# Patient Record
Sex: Female | Born: 1970 | Race: White | Hispanic: No | Marital: Single | Smoking: Current every day smoker
Health system: Southern US, Community
[De-identification: ages and names within clinical notes are randomized; demographics above are authoritative.]

## PROBLEM LIST (undated history)

## (undated) DIAGNOSIS — J449 Chronic obstructive pulmonary disease, unspecified: Secondary | ICD-10-CM

## (undated) HISTORY — PX: TUBAL LIGATION: SHX77

---

## 2001-02-07 ENCOUNTER — Inpatient Hospital Stay (HOSPITAL_COMMUNITY): Admission: AD | Admit: 2001-02-07 | Discharge: 2001-02-09 | Payer: Self-pay | Admitting: Obstetrics and Gynecology

## 2001-03-18 ENCOUNTER — Other Ambulatory Visit: Admission: RE | Admit: 2001-03-18 | Discharge: 2001-03-18 | Payer: Self-pay | Admitting: Obstetrics and Gynecology

## 2009-04-29 ENCOUNTER — Emergency Department (HOSPITAL_BASED_OUTPATIENT_CLINIC_OR_DEPARTMENT_OTHER): Admission: EM | Admit: 2009-04-29 | Discharge: 2009-04-29 | Payer: Self-pay | Admitting: Emergency Medicine

## 2009-09-25 ENCOUNTER — Emergency Department (HOSPITAL_BASED_OUTPATIENT_CLINIC_OR_DEPARTMENT_OTHER): Admission: EM | Admit: 2009-09-25 | Discharge: 2009-09-25 | Payer: Self-pay | Admitting: Emergency Medicine

## 2010-09-27 LAB — URINALYSIS, ROUTINE W REFLEX MICROSCOPIC
Bilirubin Urine: NEGATIVE
Nitrite: POSITIVE — AB
Specific Gravity, Urine: 1.009 (ref 1.005–1.030)
Urobilinogen, UA: 0.2 mg/dL (ref 0.0–1.0)
pH: 6.5 (ref 5.0–8.0)

## 2010-09-27 LAB — URINE MICROSCOPIC-ADD ON

## 2010-09-27 LAB — WET PREP, GENITAL

## 2010-09-27 LAB — GC/CHLAMYDIA PROBE AMP, GENITAL: Chlamydia, DNA Probe: NEGATIVE

## 2010-09-27 LAB — PREGNANCY, URINE: Preg Test, Ur: NEGATIVE

## 2015-05-13 ENCOUNTER — Encounter: Payer: Self-pay | Admitting: Emergency Medicine

## 2015-05-13 ENCOUNTER — Emergency Department
Admission: EM | Admit: 2015-05-13 | Discharge: 2015-05-13 | Disposition: A | Discharge status: Home / Self Care | Payer: Self-pay | Source: Home / Self Care | Attending: Family Medicine | Admitting: Family Medicine

## 2015-05-13 DIAGNOSIS — J029 Acute pharyngitis, unspecified: Secondary | ICD-10-CM

## 2015-05-13 DIAGNOSIS — J209 Acute bronchitis, unspecified: Secondary | ICD-10-CM

## 2015-05-13 MED ORDER — BENZONATATE 100 MG PO CAPS: 100.0000 mg | ORAL_CAPSULE | Freq: Three times a day (TID) | ORAL | Status: DC

## 2015-05-13 MED ORDER — PREDNISONE 20 MG PO TABS: ORAL_TABLET | ORAL | Status: DC

## 2015-05-13 MED ORDER — AZITHROMYCIN 250 MG PO TABS: 250.0000 mg | ORAL_TABLET | Freq: Every day | ORAL | Status: DC

## 2015-05-13 MED ORDER — ALBUTEROL SULFATE HFA 108 (90 BASE) MCG/ACT IN AERS: 1.0000 | INHALATION_SPRAY | Freq: Four times a day (QID) | RESPIRATORY_TRACT | Status: DC | PRN

## 2015-05-13 NOTE — ED Provider Notes (Signed)
CSN: [id number]     Arrival date & time 05/13/15  1527 History   First MD Initiated Contact with Patient 05/13/15 1534     Chief Complaint  Patient presents with  . Sore Throat   (Consider location/radiation/quality/duration/timing/severity/associated sxs/prior Treatment) HPI  Pt is a 44yo female presenting to [facility] with c/o gradually worsening sore throat with congestion and moderately productive cough with green mucous that started 4 days ago. Associated fatigue, body aches, loose stools, and headaches.  She states her son had sinus congestion last week but nowhere near as congested as pt is today.  She has tried OTC allergy medication w/o relief.  She reports hx of bronchitis and states she is a daily cigarette smoker. No hx of asthma.  She has used an inhaler last time she had bronchitis but was unsure if it helped. Denies fever, nausea or vomiting. No recent travel. Denies difficulty breathing or swallowing. She did not receive flu vaccine this year.  History reviewed. No pertinent past medical history. History reviewed. No pertinent past surgical history. History reviewed. No pertinent family history. Social History  Substance Use Topics  . Smoking status: Current Every Day Smoker    Types: Cigarettes  . Smokeless tobacco: None  . Alcohol Use: No   OB History    No data available     Review of Systems  Constitutional: Positive for fatigue. Negative for fever and chills.  HENT: Positive for congestion, rhinorrhea, sinus pressure and sore throat. Negative for ear pain, trouble swallowing and voice change.   Respiratory: Positive for cough, shortness of breath and wheezing. Negative for chest tightness.   Cardiovascular: Negative for chest pain and palpitations.  Gastrointestinal: Positive for diarrhea ( loose stools). Negative for nausea, vomiting and abdominal pain.  Musculoskeletal: Positive for myalgias and arthralgias. Negative for back pain.       Body aches  Skin: Negative  for rash.  Neurological: Positive for headaches. Negative for dizziness and light-headedness.    Allergies  Review of patient's allergies indicates not on file.  Home Medications   Prior to Admission medications   Medication Sig Start Date End Date Taking? Authorizing Provider  albuterol (PROVENTIL HFA;VENTOLIN HFA) 108 (90 BASE) MCG/ACT inhaler Inhale 1-2 puffs into the lungs every 6 (six) hours as needed for wheezing or shortness of breath. 05/13/15   Junius Finner, PA-C  azithromycin (ZITHROMAX) 250 MG tablet Take 1 tablet (250 mg total) by mouth daily. Take first 2 tablets together, then 1 every day until finished. 05/13/15   Junius Finner, PA-C  benzonatate (TESSALON) 100 MG capsule Take 1 capsule (100 mg total) by mouth every 8 (eight) hours. 05/13/15   Junius Finner, PA-C  predniSONE (DELTASONE) 20 MG tablet 3 tabs po day one, then 2 po daily x 4 days 05/13/15   Junius Finner, PA-C   Meds Ordered and Administered this Visit  Medications - No data to display  BP 110/76 mmHg  Pulse 116  Temp(Src) 98.3 F (36.8 C) (Oral)  Wt 102 lb (46.267 kg)  SpO2 100%  LMP 05/03/2015 No data found.   Physical Exam  Constitutional: She appears well-developed and well-nourished. No distress.  Thin appearing female sitting on exam bed. Appears mildly fatigued   HENT:  Head: Normocephalic and atraumatic.  Right Ear: Hearing, tympanic membrane, external ear and ear canal normal.  Left Ear: Hearing, tympanic membrane, external ear and ear canal normal.  Nose: Mucosal edema present.  Mouth/Throat: Uvula is midline and mucous membranes are normal. Posterior  oropharyngeal erythema present. No oropharyngeal exudate, posterior oropharyngeal edema or tonsillar abscesses.  Eyes: Conjunctivae are normal. No scleral icterus.  Neck: Normal range of motion. Neck supple.  Hoarse voice but no stridor  Cardiovascular: Regular rhythm and normal heart sounds.  Tachycardia present.   Mild tachycardia    Pulmonary/Chest: Effort normal. No stridor. No respiratory distress. She has no wheezes. She has rhonchi in the left lower field. She has no rales. She exhibits no tenderness.  No respiratory distress. Able to speak in full sentences. Lungs: faint rhonchi in Left lower lung field  Abdominal: Soft. Bowel sounds are normal. She exhibits no distension and no mass. There is no tenderness. There is no rebound and no guarding.  Musculoskeletal: Normal range of motion.  Neurological: She is alert.  Skin: Skin is warm and dry. She is not diaphoretic.  Nursing note and vitals reviewed.   ED Course  Procedures (including critical care time)  Labs Review Labs Reviewed - No data to display  Imaging Review No results found.     MDM   1. Acute bronchitis, unspecified organism   2. Sore throat     Pt c/o worsening URI symptoms with body aches, cough and congestion. Pt also c/o sore throat. Mild tachycardia with HR of 116. O2 Sat 100% on RA. Pt denies respiratory distress. Lungs: faint rhonchi in Left lower lung field Oropharynx: tonsillar erythema but no edema or exudates. No evidence of peritonsillar abscess.   Will tx for acute bronchititis Rx: Azithromycin, prednisone, albuterol, and tessalon  Encouraged fluids, rest, acetaminophen and ibuprofen for fever or pain. F/u with PCP in 7-10 days if not improving, sooner if worsening. Patient verbalized understanding and agreement with treatment plan.      Junius FinnerErin O'Malley, PA-C 05/13/15 [phone]

## 2015-05-13 NOTE — Discharge Instructions (Signed)
Please take antibiotics as prescribed and be sure to complete entire course even if you start to feel better to ensure infection does not come back.   Acute Bronchitis Bronchitis is when the airways that extend from the windpipe into the lungs get red, puffy, and painful (inflamed). Bronchitis often causes thick spit (mucus) to develop. This leads to a cough. A cough is the most common symptom of bronchitis. In acute bronchitis, the condition usually begins suddenly and goes away over time (usually in 2 weeks). Smoking, allergies, and asthma can make bronchitis worse. Repeated episodes of bronchitis may cause more lung problems. HOME CARE  Rest.  Drink enough fluids to keep your pee (urine) clear or pale yellow (unless you need to limit fluids as told by your doctor).  Only take over-the-counter or prescription medicines as told by your doctor.  Avoid smoking and secondhand smoke. These can make bronchitis worse. If you are a smoker, think about using nicotine gum or skin patches. Quitting smoking will help your lungs heal faster.  Reduce the chance of getting bronchitis again by:  Washing your hands often.  Avoiding people with cold symptoms.  Trying not to touch your hands to your mouth, nose, or eyes.  Follow up with your doctor as told. GET HELP IF: Your symptoms do not improve after 1 week of treatment. Symptoms include:  Cough.  Fever.  Coughing up thick spit.  Body aches.  Chest congestion.  Chills.  Shortness of breath.  Sore throat. GET HELP RIGHT AWAY IF:   You have an increased fever.  You have chills.  You have severe shortness of breath.  You have bloody thick spit (sputum).  You throw up (vomit) often.  You lose too much body fluid (dehydration).  You have a severe headache.  You faint. MAKE SURE YOU:   Understand these instructions.  Will watch your condition.  Will get help right away if you are not doing well or get worse.   This  information is not intended to replace advice given to you by your health care provider. Make sure you discuss any questions you have with your health care provider.   Document Released: 11/28/2007 Document Revised: 02/11/2013 Document Reviewed: 12/02/2012 Elsevier Interactive Patient Education 2016 [employer]  Pharyngitis Pharyngitis is a sore throat (pharynx). There is redness, pain, and swelling of your throat. HOME CARE   Drink enough fluids to keep your pee (urine) clear or pale yellow.  Only take medicine as told by your doctor.  You may get sick again if you do not take medicine as told. Finish your medicines, even if you start to feel better.  Do not take aspirin.  Rest.  Rinse your mouth (gargle) with salt water ( tsp of salt per 1 qt of water) every 1-2 hours. This will help the pain.  If you are not at risk for choking, you can suck on hard candy or sore throat lozenges. GET HELP IF:  You have large, tender lumps on your neck.  You have a rash.  You cough up green, yellow-brown, or bloody spit. GET HELP RIGHT AWAY IF:   You have a stiff neck.  You drool or cannot swallow liquids.  You throw up (vomit) or are not able to keep medicine or liquids down.  You have very bad pain that does not go away with medicine.  You have problems breathing (not from a stuffy nose). MAKE SURE YOU:   Understand these instructions.  Will watch your  condition.  Will get help right away if you are not doing well or get worse.   This information is not intended to replace advice given to you by your health care provider. Make sure you discuss any questions you have with your health care provider.   Document Released: 11/28/2007 Document Revised: 04/01/2013 Document Reviewed: 02/16/2013 Elsevier Interactive Patient Education [employer]

## 2015-05-13 NOTE — ED Notes (Signed)
Pt c/o sore throat and body aches x4 days. Denies fever.

## 2016-06-27 ENCOUNTER — Emergency Department (INDEPENDENT_AMBULATORY_CARE_PROVIDER_SITE_OTHER): Payer: Medicaid Other

## 2016-06-27 ENCOUNTER — Emergency Department
Admission: EM | Admit: 2016-06-27 | Discharge: 2016-06-27 | Disposition: A | Discharge status: Home / Self Care | Payer: Medicaid Other | Source: Home / Self Care | Attending: Family Medicine | Admitting: Family Medicine

## 2016-06-27 ENCOUNTER — Encounter: Payer: Self-pay | Admitting: Emergency Medicine

## 2016-06-27 DIAGNOSIS — J069 Acute upper respiratory infection, unspecified: Secondary | ICD-10-CM

## 2016-06-27 DIAGNOSIS — R05 Cough: Secondary | ICD-10-CM | POA: Diagnosis not present

## 2016-06-27 DIAGNOSIS — R0989 Other specified symptoms and signs involving the circulatory and respiratory systems: Secondary | ICD-10-CM

## 2016-06-27 DIAGNOSIS — Z87891 Personal history of nicotine dependence: Secondary | ICD-10-CM

## 2016-06-27 MED ORDER — ALBUTEROL SULFATE HFA 108 (90 BASE) MCG/ACT IN AERS: 1.0000 | INHALATION_SPRAY | Freq: Four times a day (QID) | RESPIRATORY_TRACT | 0 refills | Status: DC | PRN

## 2016-06-27 MED ORDER — MONTELUKAST SODIUM 10 MG PO TABS: 10.0000 mg | ORAL_TABLET | Freq: Every day | ORAL | 0 refills | Status: AC

## 2016-06-27 MED ORDER — AZITHROMYCIN 250 MG PO TABS: 250.0000 mg | ORAL_TABLET | Freq: Every day | ORAL | 0 refills | Status: DC

## 2016-06-27 MED ORDER — IPRATROPIUM-ALBUTEROL 0.5-2.5 (3) MG/3ML IN SOLN
3.0000 mL | Freq: Four times a day (QID) | RESPIRATORY_TRACT | Status: DC
  Administered 2016-06-27: 3 mL via RESPIRATORY_TRACT

## 2016-06-27 MED ORDER — METHYLPREDNISOLONE SODIUM SUCC 40 MG IJ SOLR
80.0000 mg | Freq: Once | INTRAMUSCULAR | Status: AC
  Administered 2016-06-27: 80 mg via INTRAMUSCULAR

## 2016-06-27 NOTE — ED Provider Notes (Signed)
CSN: [id number]     Arrival date & time 06/27/16  1244 History   First MD Initiated Contact with Patient 06/27/16 1316     Chief Complaint  Patient presents with  . Cough   (Consider location/radiation/quality/duration/timing/severity/associated sxs/prior Treatment) HPI Erin Vincent is a 46 y.o. female presenting to UC with c/o cough for 2 months, worsening this week over the last 3 days.  Pt notes it is hard to breath at times. Nasal congestion.  The other day pt had a panic attack due to not being able to breath from the congestion.  She has not taken anything for her symptoms. Denies taking OTC cough medication such as mucinex or Robitussin.  Pt smokes 1.5 ppd.  Denies known hx of asthma or COPD, however, reports hx of several episodes of bronchitis and has needed inhalers for the bronchitis. She does not currently have a PCP.   History reviewed. No pertinent past medical history. History reviewed. No pertinent surgical history. No family history on file. Social History  Substance Use Topics  . Smoking status: Current Every Day Smoker    Packs/day: 1.50    Years: 37.50    Types: Cigarettes  . Smokeless tobacco: Never Used  . Alcohol use No   OB History    No data available     Review of Systems  Constitutional: Negative for chills and fever.  HENT: Positive for congestion, postnasal drip, rhinorrhea and sore throat. Negative for ear pain, sinus pain and sinus pressure.   Respiratory: Positive for cough, choking (on congestion), chest tightness, shortness of breath and wheezing. Negative for stridor.   Musculoskeletal: Negative for arthralgias and myalgias.  Neurological: Negative for dizziness, light-headedness and headaches.    Allergies  Patient has no known allergies.  Home Medications   Prior to Admission medications   Medication Sig Start Date End Date Taking? Authorizing Provider  albuterol (PROVENTIL HFA;VENTOLIN HFA) 108 (90 BASE) MCG/ACT inhaler Inhale 1-2  puffs into the lungs every 6 (six) hours as needed for wheezing or shortness of breath. 05/13/15   Junius FinnerErin O'Malley, PA-C  albuterol (PROVENTIL HFA;VENTOLIN HFA) 108 (90 Base) MCG/ACT inhaler Inhale 1-2 puffs into the lungs every 6 (six) hours as needed for wheezing or shortness of breath. 06/27/16   Junius FinnerErin O'Malley, PA-C  azithromycin (ZITHROMAX) 250 MG tablet Take 1 tablet (250 mg total) by mouth daily. Take first 2 tablets together, then 1 every day until finished. 05/13/15   Junius FinnerErin O'Malley, PA-C  azithromycin (ZITHROMAX) 250 MG tablet Take 1 tablet (250 mg total) by mouth daily. Take first 2 tablets together, then 1 every day until finished. 06/27/16   Junius FinnerErin O'Malley, PA-C  benzonatate (TESSALON) 100 MG capsule Take 1 capsule (100 mg total) by mouth every 8 (eight) hours. 05/13/15   Junius FinnerErin O'Malley, PA-C  montelukast (SINGULAIR) 10 MG tablet Take 1 tablet (10 mg total) by mouth at bedtime. 06/27/16   Junius FinnerErin O'Malley, PA-C  predniSONE (DELTASONE) 20 MG tablet 3 tabs po day one, then 2 po daily x 4 days 05/13/15   Junius FinnerErin O'Malley, PA-C   Meds Ordered and Administered this Visit   Medications  ipratropium-albuterol (DUONEB) 0.5-2.5 (3) MG/3ML nebulizer solution 3 mL (3 mLs Nebulization Given 06/27/16 1401)  methylPREDNISolone sodium succinate (SOLU-MEDROL) 40 mg/mL injection 80 mg (80 mg Intramuscular Given 06/27/16 1400)    BP 117/79 (BP Location: Left Arm)   Pulse 96   Temp 98.1 F (36.7 C) (Oral)   Ht 5' (1.524 m)   Wt 103 lb (  46.7 kg)   SpO2 100%   BMI 20.12 kg/m  No data found.   Physical Exam  Constitutional: She is oriented to person, place, and time. She appears well-developed and well-nourished. No distress.  HENT:  Head: Normocephalic and atraumatic.  Right Ear: Tympanic membrane normal.  Left Ear: Tympanic membrane normal.  Nose: Nose normal.  Mouth/Throat: Uvula is midline, oropharynx is clear and moist and mucous membranes are normal.  Eyes: EOM are normal.  Neck: Normal range of motion.  Neck supple.  Cardiovascular: Normal rate.   Pulmonary/Chest: Effort normal. No respiratory distress. She has decreased breath sounds in the right lower field and the left lower field. She has wheezes. She has rhonchi. She has rales. She exhibits no tenderness.  Diffuse expiratory wheeze and coarse breath sounds. No accessory muscle use. Intermittent productive cough on exam.  Musculoskeletal: Normal range of motion.  Neurological: She is alert and oriented to person, place, and time.  Skin: Skin is warm and dry. She is not diaphoretic.  Psychiatric: She has a normal mood and affect. Her behavior is normal.  Nursing note and vitals reviewed.   Urgent Care Course   Clinical Course     Procedures (including critical care time)  Labs Review Labs Reviewed - No data to display  Imaging Review Dg Chest 2 View  Result Date: 06/27/2016 CLINICAL DATA:  Cough and congestion for 1 month. History of smoking. EXAM: CHEST  2 VIEW COMPARISON:  None. FINDINGS: The cardiac silhouette, mediastinal and hilar contours are normal. The lungs are clear. Mild hyperinflation. No pleural effusion. The bony thorax is normal. IMPRESSION: Mild hyperinflation but no infiltrates or effusions. Electronically Signed   By: Rudie Meyer M.D.   On: 06/27/2016 13:43     MDM   1. Acute upper respiratory infection   2. Hyperinflation of lungs    Pt  C/o cough and congestion for 2 months, worsening this past week, especially over the last 3 days. Diffuse wheeze and coarse breath sounds on exam. O2 Sat 100% on RA.  CXR: no evidence of pneumonia, mild hyperinflation noted.  Duoneb and depomedrol given in UC Lung sounds improved significantly.  Due to hx of smoking and recurrent bronchitis, question if pt has COPD.  Rx: Azithromycin, Albuterol and Singulair Encouraged f/u with PCP Resource guide for Smoking Cessation and PCP provided.   Junius Finner, PA-C 06/27/16 1446

## 2016-06-27 NOTE — Discharge Instructions (Signed)
°Emergency Department Resource Guide °1) Find a Doctor and Pay Out of Pocket °Although you won't have to find out who is covered by your insurance plan, it is a good idea to ask around and get recommendations. You will then need to call the office and see if the doctor you have chosen will accept you as a new patient and what types of options they offer for patients who are self-pay. Some doctors offer discounts or will set up payment plans for their patients who do not have insurance, but you will need to ask so you aren't surprised when you get to your appointment. ° °2) Contact Your Local Health Department °Not all health departments have doctors that can see patients for sick visits, but many do, so it is worth a call to see if yours does. If you don't know where your local health department is, you can check in your phone book. The CDC also has a tool to help you locate your state's health department, and many state websites also have listings of all of their local health departments. ° °3) Find a Walk-in Clinic °If your illness is not likely to be very severe or complicated, you may want to try a walk in clinic. These are popping up all over the country in pharmacies, drugstores, and shopping centers. They're usually staffed by nurse practitioners or physician assistants that have been trained to treat common illnesses and complaints. They're usually fairly quick and inexpensive. However, if you have serious medical issues or chronic medical problems, these are probably not your best option. ° °No Primary Care Doctor: °- Call [facility] at  832-8000 - they can help you locate a primary care doctor that  accepts your insurance, provides certain services, etc. °- Physician Referral Service- 1-800-533-3463 ° °Chronic Pain Problems: °Organization         Address  Phone   Notes  °[facility]  (336) 297-2271 Patients need to be referred by their primary care doctor.  ° °Medication  Assistance: °Organization         Address  Phone   Notes  °[place] [facility] [address] °[place], [place] [address] (336) 641-8030 --Must be a resident of [place] °-- Must have NO insurance coverage whatsoever (no Medicaid/ Medicare, etc.) °-- The pt. MUST have a primary care doctor that directs their care regularly and follows them in the community °  °[facility]  (866) 331-1348   °[employer]  (888) 892-1162   ° °Agencies that provide inexpensive medical care: °Organization         Address  Phone   Notes  °[facility]  (336) 832-8035   °[facility]    (336) 832-7272   °[facility] [address] °[place], [place] [address] (336) 832-4777   °[facility] [address], °[place] (336) 271-4999   °[facility]    (336) 373-0678   °[facility]    (336) 272-1050   °[facility] ° [address], [place] Phone:  (336) 832-4444, Fax:  (336) 832-4440 Hours of Operation:  9 am - 6 pm, M-F.  Also accepts Medicaid/Medicare and self-pay.  °[facility] ° [address],  Phone: (336) 832-3150, Fax: (336) 832-3151. Hours of Operation:  8:30 am - 5:30 pm, M-F.  Also accepts Medicaid and self-pay.  °[facility] [place] [address], [place] Phone: (336) 878-6027   °[facility] [address], [place], [place] (336)723-1848, Ext. 123 Mondays & Thursdays: 7-9 AM.  First 15 patients are seen on a first come, first serve basis. °  ° °Medicaid-accepting [place] Providers: ° °Organization         Address  Phone   Notes  °[facility] [address], [place] (336) 641-2100 Also accepts self-pay patients.  °[facility] [address], [place] ° (336) 856-9996   °[facility] [address], [place]  (336) 288-8857   °[facility] [address] [place] [address], [place] (336) 299-7000   °Veita Bland [address], [place]  ° (336) 373-1557 Only accepts [place] Access Medicaid patients after they have their name applied to their card.  ° °Self-Pay (no insurance) in [place]: ° °Organization         Address  Phone   Notes  °Sickle Cell Patients, [facility] [address], [place] (336) 832-1970   °[facility] [address], [place] (336) 832-4400   °[facility] ° [address], [place] (336) 992-4800   °[facility]/Dr. Osei-Bonsu ° [address] [place] [address], [place] or [address], [place] (336) 841-8500 Phone number for both [place] and [facility] locations is the same.  °[facility] [address], [place] (336) 299-0000   °[facility] [address] [place] [address], [place] or [address] (336) 852-7530 °(336) 878-2260   °[facility] [address], [place] (336) 350-1642, phone; (336) 294-5005, fax Sees patients 1st and 3rd Saturday of every month.  Must not qualify for public or private insurance (i.e. Medicaid, Medicare, [place] Health Choice, Veterans' Benefits) • Household income should be no more than 200% of the poverty level •The clinic cannot treat you if you are pregnant or think you are pregnant • Sexually transmitted diseases are not treated at the clinic.  ° ° °Dental Care: °Organization         Address  Phone  Notes  °[place] [facility] [address], [place] (336) 641-6152 Accepts children up to age 21 who are enrolled in Medicaid or [place] Health Choice; pregnant women with a Medicaid card; and children who have applied for Medicaid or [place] Health Choice, but were declined, whose parents can pay a reduced fee at time of service.  °[place]  [facility] [place]  [address], [place] (336) 641-7733 Accepts children up to age 21 who are enrolled in Medicaid or [place] Health Choice; pregnant women with a Medicaid card; and children who have applied for Medicaid or  Health Choice, but were declined, whose parents can pay a reduced fee at time of service.  °[facility] ° [address], [place] (336) 641-4533 Patients are seen by appointment only. Walk-ins are not accepted. [facility] will see patients 18 years of age and older. °Monday - Tuesday (8am-5pm) °Most Wednesdays (8:30-5pm) °$30 per visit, cash only  °[facility] ° [address], [place] (336) 641-4533 Patients are seen by appointment only. Walk-ins are not accepted. [facility] will see patients 18 years of age and older. °One   Wednesday Evening (Monthly: Volunteer Based).  $30 per visit, cash only  °[facility]  (919) 537-3737 for adults; Children under age 4, call Graduate Pediatric Dentistry at (919) [phone]. Children aged 4-14, please call (919) [phone] to request a pediatric application. ° Dental services are provided in all areas of dental care including fillings, crowns and bridges, complete and partial dentures, implants, gum treatment, root canals, and extractions. Preventive care is also provided. Treatment is provided to both adults and children. °Patients are selected via a lottery and there is often a waiting list. °  °[facility] [address], °[place] ° (336) 763-8833 [email or web address] °  °[facility] [address], [place], [place] (336)723-1848, Ext. 123 Second and Fourth Thursday of each month, opens at 6:30 AM; Clinic ends at 9 AM.  Patients are seen on a first-come first-served basis, and a limited number are seen during each clinic.  ° °[facility] ° [address], [place], [place] (336) 723-7904    Eligibility Requirements °You must have lived in [place], [place], or [place] counties for at least the last three months. °  You cannot be eligible for state or federal sponsored healthcare insurance, including Veterans Administration, Medicaid, or Medicare. °  You generally cannot be eligible for healthcare insurance through your employer.  °  How to apply: °Eligibility screenings are held every Tuesday and Wednesday afternoon from 1:00 pm until 4:00 pm. You do not need an appointment for the interview!  °[facility] [address], [place], [place] 336-631-2330   °[facility]  336-342-8273   °[facility]  336-703-3100   °[facility] [facility]  336-570-6415   ° °Behavioral Health Resources in the Community: °Intensive Outpatient Programs °Organization         Address  Phone  Notes  °[facility] [address], [place], [place] 336-878-6098   °[facility] [address], [place], [place] 336-832-9800   °[facility] [address], [place], [place] ° 336-882-2125   °[facility] [address],  °[place], [place] 1-800-853-5163 or 336-641-4981   °Substance Abuse Resources °Organization         Address  Phone  Notes  °[facility]  336-882-2125   °[facility]  336-784-9470   °[facility]  336-285-9073   °[facility]  336-845-3988   °Residential & Outpatient Substance Abuse Program  1-800-659-3381   °Psychological Services °Organization         Address  Phone  Notes  °[facility]  336- 832-9600   °[facility]  336- 378-7881   °[facility] [address], [place] 1-800-853-5163 or 336-641-4981   ° °Mobile Crisis Teams °Organization         Address  Phone  Notes  °[facility], Mobile Crisis Care Unit  1-877-626-1772   °Assertive °[facility] ° [address]  [place], [place] 336-834-9664   °Sharon DeEsch [address] °[place] 336-554-5454   ° °Self-Help/Support Groups °Organization         Address  Phone             Notes  °[facility] - variety of support groups  336- 373-1402 Call for more information  °Narcotics Anonymous (NA), [facility] [address], °[place]  2 meetings at this location  ° °  Residential Treatment Programs °Organization         Address  Phone  Notes  °[facility] [address],    °[place]  1-866-801-8205   °[facility] ° [address], [place], [place] 704-293-8524   °[facility] [address], [place] 336-845-3988 Admissions: 8am-3pm M-F  °[facility] [address]    °[place], [place] 336-841-1104   °[facility] [address], [place], [place] 336-379-7146   °[facility] [address]  °[place], [place] 336-285-9073   °[facility] Intensive Outpatient [address], [place], [place] 336-852-3033   °[facility] [address]  °[place], [place] 1-877-615-2722 or 336-784-9470   °[facility] [address] [place], [place] 336-227-7417 Accepts Medicaid  °[facility] [address]  °[place] 1-800-659-3381 Substance Abuse/Addiction Treatment  ° °[place] Behavioral Health Resources °Organization         Address  Phone  Notes  °[facility]  (888) 581-9988   °Julie Brannon, PhD [address] [place], [place]   (336) 349-5553 or (336) 951-0000   °[facility]   [address] °[place], [place] (336) 349-4454   °[facility] [address], [place], [place] (336) 342-8316 Insurance/Medicaid/sponsorship through [facility]  °[facility] [address]                                    [place], [place] (336) 342-8316 Therapy/tele-psych/case    °[facility] [address]  ° [place], [place] (336) 349-2233    °Dr. Arfeen  (336) 349-4544   °[facility] [place]  [employer] [place] [facility] 1) [address],  °2) [address], [place] °3)  [address], [place] (336) 349-3220 °(336) 342-7768 ° °(336) 342-8140   °[place] [facility] (336) 342-1394 or (336) 342-3537 (After Hours)    ° ° °

## 2016-06-27 NOTE — ED Triage Notes (Signed)
Cough x 2 months, worse this week, x 3 coughs so hard I cant breathe, nose fills up with congestion and I go into a panic attack, I need something for my breathing. Smokes 1.5 a day I offered her info in [facility] free smoking cessation program.

## 2017-11-09 ENCOUNTER — Other Ambulatory Visit: Payer: Self-pay

## 2017-11-09 ENCOUNTER — Emergency Department (INDEPENDENT_AMBULATORY_CARE_PROVIDER_SITE_OTHER)
Admission: EM | Admit: 2017-11-09 | Discharge: 2017-11-09 | Disposition: A | Discharge status: Home / Self Care | Payer: Self-pay | Source: Home / Self Care | Attending: Family Medicine | Admitting: Family Medicine

## 2017-11-09 DIAGNOSIS — J029 Acute pharyngitis, unspecified: Secondary | ICD-10-CM

## 2017-11-09 DIAGNOSIS — B9789 Other viral agents as the cause of diseases classified elsewhere: Secondary | ICD-10-CM

## 2017-11-09 DIAGNOSIS — Z20818 Contact with and (suspected) exposure to other bacterial communicable diseases: Secondary | ICD-10-CM

## 2017-11-09 DIAGNOSIS — J069 Acute upper respiratory infection, unspecified: Secondary | ICD-10-CM

## 2017-11-09 LAB — POCT RAPID STREP A (OFFICE): Rapid Strep A Screen: NEGATIVE

## 2017-11-09 MED ORDER — PREDNISONE 20 MG PO TABS: ORAL_TABLET | ORAL | 0 refills | Status: DC

## 2017-11-09 NOTE — ED Provider Notes (Signed)
Erin Vincent CARE    CSN: [id number] Arrival date & time: 11/09/17  1337     History   Chief Complaint Chief Complaint  Patient presents with  . Cough    with chest tightness    HPI Erin Vincent is a 47 y.o. female.   HPI  Erin Vincent is a 47 y.o. female presenting to UC with c/o mildly productive cough that started 2 days ago, associated chest tightness and soreness with the cough but not at rest.  Hx of COPD, she has needed to use her inhalers more recently.  She is also c/o sore throat.  she notes her daughter completed treatment for strep throat 1 week ago.  Denies SOB. Denies fever, chills, n/v/d.  Pt notes she has done well with prednisone in the past.    History reviewed. No pertinent past medical history.  There are no active problems to display for this patient.   History reviewed. No pertinent surgical history.  OB History   None      Home Medications    Prior to Admission medications   Medication Sig Start Date End Date Taking? Authorizing Provider  predniSONE (DELTASONE) 20 MG tablet 3 tabs po day one, then 2 po daily x 4 days 11/09/17   Lurene Shadow, PA-C    Family History Family History  Family history unknown: Yes    Social History Social History   Tobacco Use  . Smoking status: Never Smoker  . Smokeless tobacco: Never Used  Substance Use Topics  . Alcohol use: Never    Frequency: Never  . Drug use: Never     Allergies   Patient has no known allergies.   Review of Systems Review of Systems  Constitutional: Negative for chills and fever.  HENT: Positive for congestion and sore throat. Negative for ear pain, trouble swallowing and voice change.   Respiratory: Positive for cough and chest tightness. Negative for shortness of breath.   Cardiovascular: Positive for chest pain (chest soreness with cough). Negative for palpitations.  Gastrointestinal: Negative for abdominal pain, diarrhea, nausea and vomiting.    Musculoskeletal: Negative for arthralgias, back pain and myalgias.  Skin: Negative for rash.     Physical Exam Triage Vital Signs ED Triage Vitals  Enc Vitals Group     BP 11/09/17 1416 138/88     Pulse Rate 11/09/17 1416 60     Resp 11/09/17 1416 16     Temp 11/09/17 1416 97.8 F (36.6 C)     Temp Source 11/09/17 1416 Oral     SpO2 11/09/17 1416 94 %     Weight 11/09/17 1417 97 lb (44 kg)     Height 11/09/17 1417 5' (1.524 m)     Head Circumference --      Peak Flow --      Pain Score 11/09/17 1416 0     Pain Loc --      Pain Edu? --      Excl. in GC? --    No data found.  Updated Vital Signs BP 138/88 (BP Location: Right Arm)   Pulse 60   Temp 97.8 F (36.6 C) (Oral)   Resp 16   Ht 5' (1.524 m)   Wt 97 lb (44 kg)   SpO2 94%   BMI 18.94 kg/m    Physical Exam  Constitutional: She is oriented to person, place, and time. She appears well-developed and well-nourished. No distress.  HENT:  Head: Normocephalic and atraumatic.  Right  Ear: Tympanic membrane normal.  Left Ear: Tympanic membrane normal.  Nose: Nose normal. Right sinus exhibits no maxillary sinus tenderness and no frontal sinus tenderness. Left sinus exhibits no maxillary sinus tenderness and no frontal sinus tenderness.  Mouth/Throat: Uvula is midline and mucous membranes are normal. Posterior oropharyngeal erythema present. No oropharyngeal exudate, posterior oropharyngeal edema or tonsillar abscesses.  Eyes: EOM are normal.  Neck: Normal range of motion. Neck supple.  Cardiovascular: Normal rate and regular rhythm.  Pulmonary/Chest: Effort normal and breath sounds normal. No stridor. No respiratory distress. She has no wheezes. She has no rales.  Musculoskeletal: Normal range of motion.  Lymphadenopathy:    She has no cervical adenopathy.  Neurological: She is alert and oriented to person, place, and time.  Skin: Skin is warm and dry. She is not diaphoretic.  Psychiatric: She has a normal mood and  affect. Her behavior is normal.  Nursing note and vitals reviewed.    UC Treatments / Results  Labs (all labs ordered are listed, but only abnormal results are displayed) Labs Reviewed  POCT RAPID STREP A (OFFICE)    EKG None  Radiology No results found.  Procedures Procedures (including critical care time)  Medications Ordered in UC Medications - No data to display  Initial Impression / Assessment and Plan / UC Course  I have reviewed the triage vital signs and the nursing notes.  Pertinent labs & imaging results that were available during my care of the patient were reviewed by me and considered in my medical decision making (see chart for details).     No evidence of bacterial infection at this time.  Symptoms likely due to viral illness, exacerbating her COPD. No wheeze or respiratory distress noted on exam. Will discharge home with prednisone F/u with PCP in 1 week if needed.   Final Clinical Impressions(s) / UC Diagnoses   Final diagnoses:  Viral URI with cough  Sore throat  Exposure to strep throat   Discharge Instructions   None    ED Prescriptions    Medication Sig Dispense Auth. Provider   predniSONE (DELTASONE) 20 MG tablet 3 tabs po day one, then 2 po daily x 4 days 11 tablet Lurene Shadow, PA-C     Controlled Substance Prescriptions [place] Controlled Substance Registry consulted? Not Applicable   Rolla Plate 11/09/17 1610

## 2017-11-09 NOTE — ED Triage Notes (Signed)
Pt c/o a cough that started 2 nights ago. Hurts to cough due to a tightness feeling in her chest. No OTC meds tried.

## 2017-11-11 ENCOUNTER — Telehealth: Payer: Self-pay | Admitting: Emergency Medicine

## 2017-11-11 LAB — STREP A DNA PROBE: Group A Strep Probe: NOT DETECTED

## 2017-11-12 NOTE — Telephone Encounter (Signed)
Attempted to call, VM not set up.

## 2017-11-13 ENCOUNTER — Encounter: Payer: Self-pay | Admitting: Emergency Medicine

## 2017-11-13 ENCOUNTER — Telehealth: Payer: Self-pay | Admitting: *Deleted

## 2017-11-13 MED ORDER — AZITHROMYCIN 250 MG PO TABS: 250.0000 mg | ORAL_TABLET | Freq: Every day | ORAL | 0 refills | Status: DC

## 2017-11-13 MED ORDER — PREDNISONE 20 MG PO TABS: ORAL_TABLET | ORAL | 0 refills | Status: DC

## 2017-11-13 NOTE — Telephone Encounter (Signed)
Refill of prednisone sent to pharmacy on file along with azithromycin to treat possible atypical bacteria.  Please encourage pt to take acetaminophen and ibuprofen for headache.  Please have her follow up with PCP if not improving after Azithromycin treatment.

## 2017-11-13 NOTE — Telephone Encounter (Signed)
Patient notified of Erin's instructions. She also needs her Symbicort inhaler refilled. Erin Vincent will send.

## 2017-11-13 NOTE — Telephone Encounter (Signed)
Patient reports she improved minimally but now feels worse in her chest and that it "moved to her head". She is asking for a refill on the prednisone and what else she could take for her head. Please advise.

## 2020-02-24 ENCOUNTER — Other Ambulatory Visit: Payer: Self-pay

## 2020-02-24 ENCOUNTER — Emergency Department (INDEPENDENT_AMBULATORY_CARE_PROVIDER_SITE_OTHER)
Admission: EM | Admit: 2020-02-24 | Discharge: 2020-02-24 | Disposition: A | Discharge status: Home / Self Care | Payer: Medicaid Other | Source: Home / Self Care

## 2020-02-24 DIAGNOSIS — M722 Plantar fascial fibromatosis: Secondary | ICD-10-CM | POA: Diagnosis not present

## 2020-02-24 HISTORY — DX: Chronic obstructive pulmonary disease, unspecified: J44.9

## 2020-02-24 MED ORDER — TIZANIDINE HCL 4 MG PO TABS: 4.0000 mg | ORAL_TABLET | Freq: Every evening | ORAL | 0 refills | Status: DC | PRN

## 2020-02-24 MED ORDER — PREDNISONE 50 MG PO TABS: 50.0000 mg | ORAL_TABLET | Freq: Every day | ORAL | 0 refills | Status: AC

## 2020-02-24 NOTE — Discharge Instructions (Addendum)
If symptoms worsen or do not improve, please follow-up with Podiatry . Take medication as prescribed.

## 2020-02-24 NOTE — ED Provider Notes (Signed)
[facility]    CSN: [id number] Arrival date & time: 02/24/20  1353      History   Chief Complaint Chief Complaint  Patient presents with  . Foot Pain    HPI Erin Vincent is a 49 y.o. female.   HPI  Patient presents today with acute onset left foot pain. Patient works at a [employer] and is on her feet for prolonged hours per day. She denies any injury. The pain starts in the heel and is radiating throughout her foot and now up the leg. She has taken ibuprofen and purchased gel inserts for shoes without improvement of pain symptoms. No swelling, redness, or bruising of foot or leg.   Past Medical History:  Diagnosis Date  . COPD (chronic obstructive pulmonary disease) (HCC)     There are no problems to display for this patient.   Past Surgical History:  Procedure Laterality Date  . TUBAL LIGATION      OB History   No obstetric history on file.      Home Medications    Prior to Admission medications   Medication Sig Start Date End Date Taking? Authorizing Provider  albuterol (PROVENTIL HFA;VENTOLIN HFA) 108 (90 BASE) MCG/ACT inhaler Inhale 1-2 puffs into the lungs every 6 (six) hours as needed for wheezing or shortness of breath. 05/13/15  Yes Phelps, Erin O, PA-C  albuterol (PROVENTIL HFA;VENTOLIN HFA) 108 (90 Base) MCG/ACT inhaler Inhale 1-2 puffs into the lungs every 6 (six) hours as needed for wheezing or shortness of breath. 06/27/16  Yes Phelps, Vangie Bicker, PA-C  predniSONE (DELTASONE) 20 MG tablet 3 tabs po day one, then 2 po daily x 4 days 11/09/17  Yes Phelps, Erin O, PA-C  azithromycin (ZITHROMAX) 250 MG tablet Take 1 tablet (250 mg total) by mouth daily. Take first 2 tablets together, then 1 every day until finished. 05/13/15   Lurene Shadow, PA-C  azithromycin (ZITHROMAX) 250 MG tablet Take 1 tablet (250 mg total) by mouth daily. Take first 2 tablets together, then 1 every day until finished. 06/27/16   Lurene Shadow, PA-C    azithromycin (ZITHROMAX) 250 MG tablet Take 1 tablet (250 mg total) by mouth daily. Take first 2 tablets together, then 1 every day until finished. 11/13/17   Lurene Shadow, PA-C  benzonatate (TESSALON) 100 MG capsule Take 1 capsule (100 mg total) by mouth every 8 (eight) hours. 05/13/15   Lurene Shadow, PA-C  montelukast (SINGULAIR) 10 MG tablet Take 1 tablet (10 mg total) by mouth at bedtime. 06/27/16   Lurene Shadow, PA-C  predniSONE (DELTASONE) 20 MG tablet 3 tabs po day one, then 2 po daily x 4 days 05/13/15   Lurene Shadow, PA-C  predniSONE (DELTASONE) 20 MG tablet 3 tabs po day one, then 2 po daily x 4 days 11/13/17   Lurene Shadow, PA-C    Family History Family History  Family history unknown: Yes    Social History Social History   Tobacco Use  . Smoking status: Never Smoker  . Smokeless tobacco: Never Used  Vaping Use  . Vaping Use: Never used  Substance Use Topics  . Alcohol use: Never  . Drug use: Never     Allergies   Patient has no known allergies.  Review of Systems Review of Systems Pertinent negatives listed in HPI  Physical Exam Triage Vital Signs ED Triage Vitals  Enc Vitals Group     BP 02/24/20 1430 123/78  Pulse Rate 02/24/20 1430 79     Resp 02/24/20 1430 18     Temp 02/24/20 1430 98.2 F (36.8 C)     Temp Source 02/24/20 1430 Oral     SpO2 02/24/20 1430 97 %     Weight --      Height --      Head Circumference --      Peak Flow --      Pain Score 02/24/20 1427 10     Pain Loc --      Pain Edu? --      Excl. in GC? --    No data found.  Updated Vital Signs BP 123/78 (BP Location: Left Arm)   Pulse 79   Temp 98.2 F (36.8 C) (Oral)   Resp 18   SpO2 97%   Visual Acuity Right Eye Distance:   Left Eye Distance:   Bilateral Distance:    Right Eye Near:   Left Eye Near:    Bilateral Near:     Physical Exam General appearance: alert, well developed, well nourished, cooperative and in no distress Head: Normocephalic,  without obvious abnormality, atraumatic Respiratory: Respirations even and unlabored, normal respiratory rate Heart: rate and rhythm normal. No gallop or murmurs noted on exam  Extremities: No deformities of left or right foot present, + 2 pulse Bilateral feet. Skin: Skin color, texture, turgor normal. No rashes seen  Psych: Appropriate mood and affect. .  UC Treatments / Results  Labs (all labs ordered are listed, but only abnormal results are displayed) Labs Reviewed - No data to display  EKG   Radiology No results found.  Procedures Procedures (including critical care time)  Medications Ordered in UC Medications - No data to display  Initial Impression / Assessment and Plan / UC Course  I have reviewed the triage vital signs and the nursing notes.  Pertinent labs & imaging results that were available during my care of the patient were reviewed by me and considered in my medical decision making (see chart for details).     Prednisone 50 mg daily x 5 days.  Tizanidine 4 mg QHS PRN Work note provided for 2 days. Podiatry follow-up if symptoms worsen. Continue gel inserts. Final Clinical Impressions(s) / UC Diagnoses   Final diagnoses:  Plantar fasciitis of right foot     Discharge Instructions     If symptoms worsen or do not improve, please follow-up with Podiatry . Take medication as prescribed.    ED Prescriptions    Medication Sig Dispense Auth. Provider   predniSONE (DELTASONE) 50 MG tablet Take 1 tablet (50 mg total) by mouth daily with breakfast for 5 days. 5 tablet Bing Neighbors, FNP   tiZANidine (ZANAFLEX) 4 MG tablet Take 1 tablet (4 mg total) by mouth at bedtime as needed (foot pain). 30 tablet Bing Neighbors, FNP     PDMP not reviewed this encounter.   Bing Neighbors, FNP 02/24/20 1525

## 2020-02-24 NOTE — ED Triage Notes (Signed)
Pt states she is on feet all day at work and started having R heel pain approx 4 days ago.  Pain is now entire bottom of feet and shooting up RLE.  No injury, bruising, swelling.  Difficult to place weight on foot.  Used Tylenol, Motrin, heat and ice, Biofreeze at home.  No relief.

## 2020-03-09 ENCOUNTER — Emergency Department (INDEPENDENT_AMBULATORY_CARE_PROVIDER_SITE_OTHER)
Admission: EM | Admit: 2020-03-09 | Discharge: 2020-03-09 | Disposition: A | Discharge status: Home / Self Care | Payer: Medicaid Other | Source: Home / Self Care

## 2020-03-09 ENCOUNTER — Ambulatory Visit (INDEPENDENT_AMBULATORY_CARE_PROVIDER_SITE_OTHER): Payer: Medicaid Other | Admitting: Family Medicine

## 2020-03-09 ENCOUNTER — Emergency Department (INDEPENDENT_AMBULATORY_CARE_PROVIDER_SITE_OTHER): Payer: Medicaid Other

## 2020-03-09 ENCOUNTER — Ambulatory Visit: Payer: Self-pay

## 2020-03-09 ENCOUNTER — Encounter: Payer: Self-pay | Admitting: Family Medicine

## 2020-03-09 ENCOUNTER — Other Ambulatory Visit: Payer: Self-pay

## 2020-03-09 VITALS — BP 122/85 | HR 96 | Ht 60.0 in | Wt 109.0 lb

## 2020-03-09 DIAGNOSIS — M79671 Pain in right foot: Secondary | ICD-10-CM

## 2020-03-09 DIAGNOSIS — M84374A Stress fracture, right foot, initial encounter for fracture: Secondary | ICD-10-CM

## 2020-03-09 DIAGNOSIS — M25471 Effusion, right ankle: Secondary | ICD-10-CM | POA: Diagnosis not present

## 2020-03-09 DIAGNOSIS — M25571 Pain in right ankle and joints of right foot: Secondary | ICD-10-CM

## 2020-03-09 DIAGNOSIS — M84376A Stress fracture, unspecified foot, initial encounter for fracture: Secondary | ICD-10-CM | POA: Insufficient documentation

## 2020-03-09 DIAGNOSIS — M7989 Other specified soft tissue disorders: Secondary | ICD-10-CM

## 2020-03-09 NOTE — Progress Notes (Signed)
Erin Vincent - 49 y.o. female MRN [id number]  Date of birth: 1970/07/26  SUBJECTIVE:  Including CC & ROS.  Chief Complaint  Patient presents with  . Foot Pain    bilateral / right worse    Erin Vincent is a 49 y.o. female that is presenting with right heel pain.  Pain is been ongoing for 3 weeks.  It is worse with the first few steps but also when she is sitting down.  Has not had any improvement with the prednisone.  No history of similar pain.  Localized to the heel with swelling around the ankle.  No history of stress fracture..  Independent review of the right ankle x-ray from 9/15 shows no acute changes.  Independent review of the right ankle x-ray from 9/15 shows no acute changes.   Review of Systems See HPI   HISTORY: Past Medical, Surgical, Social, and Family History Reviewed & Updated per EMR.   Pertinent Historical Findings include:  Past Medical History:  Diagnosis Date  . COPD (chronic obstructive pulmonary disease) (HCC)     Past Surgical History:  Procedure Laterality Date  . TUBAL LIGATION      Family History  Problem Relation Age of Onset  . Hypertension Mother   . Polycystic kidney disease Brother   . Hypertension Brother     Social History   Socioeconomic History  . Marital status: Single    Spouse name: Not on file  . Number of children: Not on file  . Years of education: Not on file  . Highest education level: Not on file  Occupational History  . Not on file  Tobacco Use  . Smoking status: Current Every Day Smoker    Packs/day: 0.50    Years: 30.00    Pack years: 15.00    Types: Cigarettes  . Smokeless tobacco: Never Used  Vaping Use  . Vaping Use: Never used  Substance and Sexual Activity  . Alcohol use: Never  . Drug use: Never  . Sexual activity: Yes  Other Topics Concern  . Not on file  Social History Narrative   ** Merged History Encounter **       Social Determinants of Health   Financial Resource Strain:   .  Difficulty of Paying Living Expenses: Not on file  Food Insecurity:   . Worried About Programme researcher, broadcasting/film/video in the Last Year: Not on file  . Ran Out of Food in the Last Year: Not on file  Transportation Needs:   . Lack of Transportation (Medical): Not on file  . Lack of Transportation (Non-Medical): Not on file  Physical Activity:   . Days of Exercise per Week: Not on file  . Minutes of Exercise per Session: Not on file  Stress:   . Feeling of Stress : Not on file  Social Connections:   . Frequency of Communication with Friends and Family: Not on file  . Frequency of Social Gatherings with Friends and Family: Not on file  . Attends Religious Services: Not on file  . Active Member of Clubs or Organizations: Not on file  . Attends Banker Meetings: Not on file  . Marital Status: Not on file  Intimate Partner Violence:   . Fear of Current or Ex-Partner: Not on file  . Emotionally Abused: Not on file  . Physically Abused: Not on file  . Sexually Abused: Not on file     PHYSICAL EXAM:  VS: BP 122/85   Pulse 96  Ht 5' (1.524 m)   Wt 109 lb (49.4 kg)   LMP 05/03/2015   BMI 21.29 kg/m  Physical Exam Gen: NAD, alert, cooperative with exam, well-appearing MSK:  Right ankle and foot: No tenderness to palpation at the base of the heel. Normal ankle range of motion. No ecchymosis. Does have swelling around the ankle and heel. No pain to palpation at the Achilles tendon. Neurovascular intact  Limited ultrasound: Right ankle and foot:  Normal-appearing Achilles tendon at the insertion. Small effusion within the ankle joint. Normal-appearing posterior tibialis. Cobblestoning of the soft tissue to represent edema. Normal-appearing plantar fascia. Increased hyperemia at the medial aspect of the calcaneus to suggest a stress fracture.  Summary: Findings would suggest a stress fracture of the medial calcaneus.  Ultrasound and interpretation by Clare Gandy,  MD    ASSESSMENT & PLAN:   Stress fracture of calcaneus due to multiple or repetitive stress Findings are suggestive of a stress fracture of the calcaneus. -Counseled on supportive care. -Provided work note. -Cam walker. -Follow-up in 3 weeks.

## 2020-03-09 NOTE — Patient Instructions (Signed)
Nice to meet you  Please try Vitamin D 800 IU daily and 1200 mg of calcium  Please try vitamin K2  Please use the CAM walker   Please use ice  Please send me a message in MyChart with any questions or updates.  Please see me back in 3 weeks.   --Dr. Jordan Likes

## 2020-03-09 NOTE — ED Provider Notes (Signed)
[name]    CSN: [id number] Arrival date & time: 03/09/20  0846      History   Chief Complaint Chief Complaint  Patient presents with  . Ankle Pain    right    HPI Erin Vincent is a 49 y.o. female.   HPI Patient seen exactly 2 weeks from today initially for pain involving the entire heel of her foot radiating down to her toes.  She works at [employer] and is on her feet most of the day.  She was treated 2 weeks ago with high-dose prednisone for course of 5 days for plantar fasciitis.  She reports prednisone did not improve pain at all.  She has not been taking ibuprofen intermittently without any improvement of pain.  During her last visit she was recommended to purchase some gel inserts inside of her shoes as she is standing on concrete most of the time.  She was also referred to follow-up with [facility] however has been unable to secure an appointment after calling multiple times.  Today her entire ankle and foot are swollen.  Severe pain with weightbearing.  She has not had any known distant injury or recent injury. Past Medical History:  Diagnosis Date  . COPD (chronic obstructive pulmonary disease) (HCC)     There are no problems to display for this patient.   Past Surgical History:  Procedure Laterality Date  . TUBAL LIGATION      OB History   No obstetric history on file.      Home Medications    Prior to Admission medications   Medication Sig Start Date End Date Taking? Authorizing Provider  albuterol (PROVENTIL HFA;VENTOLIN HFA) 108 (90 BASE) MCG/ACT inhaler Inhale 1-2 puffs into the lungs every 6 (six) hours as needed for wheezing or shortness of breath. 05/13/15   Lurene Shadow, PA-C  albuterol (PROVENTIL HFA;VENTOLIN HFA) 108 (90 Base) MCG/ACT inhaler Inhale 1-2 puffs into the lungs every 6 (six) hours as needed for wheezing or shortness of breath. 06/27/16   Lurene Shadow, PA-C  montelukast (SINGULAIR) 10 MG tablet Take 1  tablet (10 mg total) by mouth at bedtime. 06/27/16   Lurene Shadow, PA-C  tiZANidine (ZANAFLEX) 4 MG tablet Take 1 tablet (4 mg total) by mouth at bedtime as needed (foot pain). 02/24/20   Bing Neighbors, FNP    Family History Family History  Problem Relation Age of Onset  . Hypertension Mother   . Polycystic kidney disease Brother   . Hypertension Brother     Social History Social History   Tobacco Use  . Smoking status: Current Every Day Smoker    Packs/day: 0.50    Years: 30.00    Pack years: 15.00    Types: Cigarettes  . Smokeless tobacco: Never Used  Vaping Use  . Vaping Use: Never used  Substance Use Topics  . Alcohol use: Never  . Drug use: Never     Allergies   Patient has no known allergies.   Review of Systems Review of Systems Pertinent negatives listed in HPI Physical Exam Triage Vital Signs ED Triage Vitals  Enc Vitals Group     BP 03/09/20 0856 106/75     Pulse Rate 03/09/20 0856 92     Resp 03/09/20 0856 20     Temp 03/09/20 0856 98.4 F (36.9 C)     Temp Source 03/09/20 0856 Oral     SpO2 03/09/20 0856 97 %  Weight 03/09/20 0859 109 lb (49.4 kg)     Height 03/09/20 0859 4\' 11"  (1.499 m)     Head Circumference --      Peak Flow --      Pain Score --      Pain Loc --      Pain Edu? --      Excl. in GC? --    No data found.  Updated Vital Signs BP 106/75 (BP Location: Left Arm)   Pulse 92   Temp 98.4 F (36.9 C) (Oral)   Resp 20   Ht 4\' 11"  (1.499 m)   Wt 109 lb (49.4 kg)   LMP 05/03/2015   SpO2 97%   BMI 22.02 kg/m   Visual Acuity Right Eye Distance:   Left Eye Distance:   Bilateral Distance:    Right Eye Near:   Left Eye Near:    Bilateral Near:     Physical Exam Cardiovascular:     Rate and Rhythm: Normal rate and regular rhythm.  Pulmonary:     Effort: Pulmonary effort is normal.  Musculoskeletal:     Right ankle: Swelling present. Tenderness present. Decreased range of motion.     Right Achilles Tendon:  Tenderness present.     Right foot: Swelling and bony tenderness present.  Psychiatric:        Attention and Perception: She is attentive.        Mood and Affect: Mood and affect normal.      UC Treatments / Results  Labs (all labs ordered are listed, but only abnormal results are displayed) Labs Reviewed - No data to display  EKG   Radiology No results found.  Procedures Procedures (including critical care time)  Medications Ordered in UC Medications - No data to display  Initial Impression / Assessment and Plan / UC Course  I have reviewed the triage vital signs and the nursing notes.  Pertinent labs & imaging results that were available during my care of the patient were reviewed by me and considered in my medical decision making (see chart for details).     Patient presents to urgent care for the second time for worsening pain involving the right foot now ankle.  She reports no ankle swelling and has not had any relief with the trial course of prednisone given.  I am referring her emergently to Dr. sweats over at [facility].  He will evaluate in treat her based on his recommendations.  X-ray results have been taken and indicate no acute ankle fracture.  Final Clinical Impressions(s) / UC Diagnoses   Final diagnoses:  Right ankle swelling  Swelling of right foot  Pain of right heel     Discharge Instructions     Follow-up with Dr. Riki Rusk today for further evaluation . Appointments already scheduled  1050 am at [facility].    ED Prescriptions    None     PDMP not reviewed this encounter.   [facility], FNP 03/10/20 0004

## 2020-03-09 NOTE — ED Triage Notes (Signed)
Pt presents today with similar complaint as of last visit 11/1. She has R heel pain that has now progressed into R ankle throbbing and sharp pain 8/0-10. Pt has taken prednisone and ibuprofen without any improvement. No known injury related to the pain. Of note, pt works at [employer] and stands all day.

## 2020-03-09 NOTE — Assessment & Plan Note (Signed)
Findings are suggestive of a stress fracture of the calcaneus. -Counseled on supportive care. -Provided work note. -Cam walker. -Follow-up in 3 weeks.

## 2020-03-09 NOTE — Discharge Instructions (Signed)
Follow-up with Dr. Clare Gandy today for further evaluation . Appointments already scheduled  1050 am at [facility].

## 2020-03-15 ENCOUNTER — Telehealth: Payer: Self-pay | Admitting: Family Medicine

## 2020-03-15 NOTE — Telephone Encounter (Signed)
Pt called states she is having some significant pain in foot ankle when standing for long periods of time works @ [employer] & is on her feet most of the time --- Pt seeking advice, wants to know if OV needed or if Dr. Jordan Likes can contact her with what should be done next.  --glh

## 2020-03-16 ENCOUNTER — Encounter: Payer: Self-pay | Admitting: Family Medicine

## 2020-03-17 ENCOUNTER — Other Ambulatory Visit: Payer: Self-pay | Admitting: Family Medicine

## 2020-03-30 ENCOUNTER — Ambulatory Visit: Payer: Medicaid Other | Admitting: Family Medicine

## 2020-03-30 NOTE — Progress Notes (Deleted)
  Erin Vincent - 49 y.o. female MRN [id number]  Date of birth: [birth year]  SUBJECTIVE:  Including CC & ROS.  No chief complaint on file.   Erin Vincent is a 49 y.o. female that is  ***.  ***   Review of Systems See HPI   HISTORY: Past Medical, Surgical, Social, and Family History Reviewed & Updated per EMR.   Pertinent Historical Findings include:  Past Medical History:  Diagnosis Date  . COPD (chronic obstructive pulmonary disease) (HCC)     Past Surgical History:  Procedure Laterality Date  . TUBAL LIGATION      Family History  Problem Relation Age of Onset  . Hypertension Mother   . Polycystic kidney disease Brother   . Hypertension Brother     Social History   Socioeconomic History  . Marital status: Single    Spouse name: Not on file  . Number of children: Not on file  . Years of education: Not on file  . Highest education level: Not on file  Occupational History  . Not on file  Tobacco Use  . Smoking status: Current Every Day Smoker    Packs/day: 0.50    Years: 30.00    Pack years: 15.00    Types: Cigarettes  . Smokeless tobacco: Never Used  Vaping Use  . Vaping Use: Never used  Substance and Sexual Activity  . Alcohol use: Never  . Drug use: Never  . Sexual activity: Yes  Other Topics Concern  . Not on file  Social History Narrative   ** Merged History Encounter **       Social Determinants of Health   Financial Resource Strain:   . Difficulty of Paying Living Expenses: Not on file  Food Insecurity:   . Worried About Programme researcher, broadcasting/film/video in the Last Year: Not on file  . Ran Out of Food in the Last Year: Not on file  Transportation Needs:   . Lack of Transportation (Medical): Not on file  . Lack of Transportation (Non-Medical): Not on file  Physical Activity:   . Days of Exercise per Week: Not on file  . Minutes of Exercise per Session: Not on file  Stress:   . Feeling of Stress : Not on file  Social Connections:   .  Frequency of Communication with Friends and Family: Not on file  . Frequency of Social Gatherings with Friends and Family: Not on file  . Attends Religious Services: Not on file  . Active Member of Clubs or Organizations: Not on file  . Attends Banker Meetings: Not on file  . Marital Status: Not on file  Intimate Partner Violence:   . Fear of Current or Ex-Partner: Not on file  . Emotionally Abused: Not on file  . Physically Abused: Not on file  . Sexually Abused: Not on file     PHYSICAL EXAM:  VS: LMP 05/03/2015  Physical Exam Gen: NAD, alert, cooperative with exam, well-appearing MSK:  ***      ASSESSMENT & PLAN:   No problem-specific Assessment & Plan notes found for this encounter.

## 2020-05-11 ENCOUNTER — Emergency Department (INDEPENDENT_AMBULATORY_CARE_PROVIDER_SITE_OTHER)
Admission: EM | Admit: 2020-05-11 | Discharge: 2020-05-11 | Disposition: A | Discharge status: Home / Self Care | Payer: Medicaid Other | Source: Home / Self Care | Attending: Family Medicine | Admitting: Family Medicine

## 2020-05-11 ENCOUNTER — Other Ambulatory Visit: Payer: Self-pay

## 2020-05-11 DIAGNOSIS — J209 Acute bronchitis, unspecified: Secondary | ICD-10-CM | POA: Diagnosis not present

## 2020-05-11 DIAGNOSIS — J9801 Acute bronchospasm: Secondary | ICD-10-CM

## 2020-05-11 MED ORDER — DOXYCYCLINE HYCLATE 100 MG PO CAPS: ORAL_CAPSULE | ORAL | 0 refills | Status: AC

## 2020-05-11 MED ORDER — PREDNISONE 20 MG PO TABS: ORAL_TABLET | ORAL | 0 refills | Status: AC

## 2020-05-11 MED ORDER — GUAIFENESIN-CODEINE 100-10 MG/5ML PO SOLN: ORAL | 0 refills | Status: AC

## 2020-05-11 MED ORDER — ALBUTEROL SULFATE HFA 108 (90 BASE) MCG/ACT IN AERS: 2.0000 | INHALATION_SPRAY | Freq: Four times a day (QID) | RESPIRATORY_TRACT | 1 refills | Status: AC | PRN

## 2020-05-11 NOTE — Discharge Instructions (Addendum)
Take plain guaifenesin (1200mg extended release tabs such as Mucinex) twice daily, with plenty of water, for cough and congestion.  May add Pseudoephedrine (30mg, one or two every 4 to 6 hours) for sinus congestion.  Get adequate rest.   °May use Afrin nasal spray (or generic oxymetazoline) each morning for about 5 days and then discontinue.  Also recommend using saline nasal spray several times daily and saline nasal irrigation (AYR is a common brand).  Use Flonase nasal spray each morning after using Afrin nasal spray and saline nasal irrigation. °Try warm salt water gargles for sore throat.  °Stop all antihistamines for now, and other non-prescription cough/cold preparations. °  °  °

## 2020-05-11 NOTE — ED Triage Notes (Signed)
Pt complains of coughing for past two weeks with increase in symptoms on Friday. Patient has been taking cough medications and tylenol. Waking up 3 times a night from coughing, rib cage pain, muscle weakness.

## 2020-05-17 NOTE — ED Provider Notes (Signed)
[facility]    CSN: [id number] Arrival date & time: 05/11/20  0931      History   Chief Complaint Chief Complaint  Patient presents with  . Cough    HPI Erin Vincent is a 49 y.o. female.   Patient complains of developing a non-productive cough about two weeks ago.  The cough became significantly worse five days ago with additional symptoms of nausea, increased sinus congestion, fatigue, myalgias, hoarseness, sweats, nausea, and headache.  The cough awakens her at night.  She denies changes in taste/smell. She has a history of COPD and continues to smoke.  Review of records reveals an x-ray report 06/27/16 showing hyperinflation.  The history is provided by the patient.    Past Medical History:  Diagnosis Date  . COPD (chronic obstructive pulmonary disease) [facility])     Patient Active Problem List   Diagnosis Date Noted  . Stress fracture of calcaneus due to multiple or repetitive stress 03/09/2020    Past Surgical History:  Procedure Laterality Date  . TUBAL LIGATION      OB History   No obstetric history on file.      Home Medications    Prior to Admission medications   Medication Sig Start Date End Date Taking? Authorizing Provider  albuterol (VENTOLIN HFA) 108 (90 Base) MCG/ACT inhaler Inhale 2 puffs into the lungs every 6 (six) hours as needed for wheezing or shortness of breath. 05/11/20   Lattie Haw, MD  doxycycline (VIBRAMYCIN) 100 MG capsule Take one cap PO Q12hr with food. 05/11/20   Lattie Haw, MD  guaiFENesin-codeine 100-10 MG/5ML syrup Take 37mL by mouth at bedtime as needed for cough. 05/11/20   Lattie Haw, MD  montelukast (SINGULAIR) 10 MG tablet Take 1 tablet (10 mg total) by mouth at bedtime. 06/27/16   Lurene Shadow, PA-C  predniSONE (DELTASONE) 20 MG tablet Take one tab by mouth twice daily for 4 days, then one daily for 3 days. Take with food. 05/11/20   Lattie Haw, MD  tiZANidine (ZANAFLEX) 4 MG tablet  Take 1 tablet (4 mg total) by mouth at bedtime as needed (foot pain). 02/24/20   Bing Neighbors, FNP    Family History Family History  Problem Relation Age of Onset  . Hypertension Mother   . Heart failure Mother   . Polycystic kidney disease Brother   . Hypertension Brother     Social History Social History   Tobacco Use  . Smoking status: Current Every Day Smoker    Packs/day: 0.50    Years: 30.00    Pack years: 15.00    Types: Cigarettes  . Smokeless tobacco: Never Used  Vaping Use  . Vaping Use: Never used  Substance Use Topics  . Alcohol use: Yes    Alcohol/week: 2.0 standard drinks    Types: 2 Standard drinks or equivalent per week  . Drug use: Never     Allergies   Patient has no known allergies.   Review of Systems Review of Systems + sore throat + hoarse + cough No pleuritic pain, but complains of bilateral chest soreness No wheezing + nasal congestion + post-nasal drainage No sinus pain/pressure No itchy/red eyes No earache No hemoptysis No SOB No fever, + chills/sweats + nausea No vomiting No abdominal pain No diarrhea No urinary symptoms No skin rash + fatigue + myalgias + headache Used OTC meds (Mucinex) without relief   Physical Exam Triage Vital Signs ED Triage Vitals  Enc Vitals Group     BP 05/11/20 1057 113/78     Pulse Rate 05/11/20 1057 81     Resp 05/11/20 1057 18     Temp 05/11/20 1057 97.9 F (36.6 C)     Temp src --      SpO2 05/11/20 1057 98 %     Weight --      Height --      Head Circumference --      Peak Flow --      Pain Score 05/11/20 1050 8     Pain Loc --      Pain Edu? --      Excl. in GC? --    No data found.  Updated Vital Signs BP 113/78 (BP Location: Right Arm)   Pulse 81   Temp 97.9 F (36.6 C)   Resp 18   LMP 05/03/2015   SpO2 98%   Visual Acuity Right Eye Distance:   Left Eye Distance:   Bilateral Distance:    Right Eye Near:   Left Eye Near:    Bilateral Near:     Physical  Exam Nursing notes and Vital Signs reviewed. Appearance:  Patient appears stated age, and in no acute distress Eyes:  Pupils are equal, round, and reactive to light and accomodation.  Extraocular movement is intact.  Conjunctivae are not inflamed  Ears:  Canals normal.  Tympanic membranes normal.  Nose:  Mildly congested turbinates.  No sinus tenderness. Pharynx:  Normal Neck:  Supple.  Mildly enlarged lateral nodes are present, tender to palpation on the left.   Lungs:  Faint wheezes are present bilaterally.  Breath sounds are equal.  Moving air well. Heart:  Regular rate and rhythm without murmurs, rubs, or gallops.  Abdomen:  Nontender without masses or hepatosplenomegaly.  Bowel sounds are present.  No CVA or flank tenderness.  Extremities:  No edema.  Skin:  No rash present.   UC Treatments / Results  Labs (all labs ordered are listed, but only abnormal results are displayed) Labs Reviewed - No data to display  EKG   Radiology No results found.  Procedures Procedures (including critical care time)  Medications Ordered in UC Medications - No data to display  Initial Impression / Assessment and Plan / UC Course  I have reviewed the triage vital signs and the nursing notes.  Pertinent labs & imaging results that were available during my care of the patient were reviewed by me and considered in my medical decision making (see chart for details).    Begin doxycycline, prednisone burst/taper, and albuterol inhaler. Rx for Robitussin AC for night time cough.  Controlled Substance Prescriptions I have consulted the [place] Controlled Substances Registry for this patient, and feel the risk/benefit ratio today is favorable for proceeding with this prescription for a controlled substance.  Followup with Family Doctor if not improved in one week.   Final Clinical Impressions(s) / UC Diagnoses   Final diagnoses:  Acute bronchitis, unspecified organism  Bronchospasm, acute      Discharge Instructions     Take plain guaifenesin (1200mg  extended release tabs such as Mucinex) twice daily, with plenty of water, for cough and congestion.  May add Pseudoephedrine (30mg , one or two every 4 to 6 hours) for sinus congestion.  Get adequate rest.   May use Afrin nasal spray (or generic oxymetazoline) each morning for about 5 days and then discontinue.  Also recommend using saline nasal spray several times daily and saline  nasal irrigation (AYR is a common brand).  Use Flonase nasal spray each morning after using Afrin nasal spray and saline nasal irrigation. Try warm salt water gargles for sore throat.  Stop all antihistamines for now, and other non-prescription cough/cold preparations.      ED Prescriptions    Medication Sig Dispense Auth. Provider   albuterol (VENTOLIN HFA) 108 (90 Base) MCG/ACT inhaler Inhale 2 puffs into the lungs every 6 (six) hours as needed for wheezing or shortness of breath. 18 g Lattie Haw, MD   predniSONE (DELTASONE) 20 MG tablet Take one tab by mouth twice daily for 4 days, then one daily for 3 days. Take with food. 11 tablet Lattie Haw, MD   doxycycline (VIBRAMYCIN) 100 MG capsule Take one cap PO Q12hr with food. 14 capsule Lattie Haw, MD   guaiFENesin-codeine 100-10 MG/5ML syrup Take 57mL by mouth at bedtime as needed for cough. 50 mL Lattie Haw, MD        Lattie Haw, MD 05/17/20 [phone]

## 2020-07-19 ENCOUNTER — Other Ambulatory Visit: Payer: Self-pay

## 2020-07-19 ENCOUNTER — Emergency Department (INDEPENDENT_AMBULATORY_CARE_PROVIDER_SITE_OTHER)
Admission: EM | Admit: 2020-07-19 | Discharge: 2020-07-19 | Disposition: A | Discharge status: Home / Self Care | Payer: BC Managed Care – PPO | Source: Home / Self Care | Attending: Family Medicine | Admitting: Family Medicine

## 2020-07-19 DIAGNOSIS — J069 Acute upper respiratory infection, unspecified: Secondary | ICD-10-CM | POA: Diagnosis not present

## 2020-07-19 DIAGNOSIS — Z20822 Contact with and (suspected) exposure to covid-19: Secondary | ICD-10-CM | POA: Diagnosis not present

## 2020-07-19 NOTE — ED Triage Notes (Signed)
Patient presents to Urgent Care with complaints of fatigue since 2-3 days ago. Patient reports she has not been tested for covid recently, would like to be tested today.

## 2020-07-19 NOTE — ED Provider Notes (Signed)
[name]    CSN: [id number] Arrival date & time: 07/19/20  1811      History   Chief Complaint Chief Complaint  Patient presents with  . Fatigue    HPI Zaleah Ternes is a 50 y.o. female.   She is presenting with congestion and malaise.  She has not received the Covid vaccine.  She has a history of COPD.  Symptoms ongoing for a few days.  Seem to be getting worse.  No wheezing.  HPI  Past Medical History:  Diagnosis Date  . COPD (chronic obstructive pulmonary disease) [facility])     Patient Active Problem List   Diagnosis Date Noted  . Stress fracture of calcaneus due to multiple or repetitive stress 03/09/2020    Past Surgical History:  Procedure Laterality Date  . TUBAL LIGATION      OB History   No obstetric history on file.      Home Medications    Prior to Admission medications   Medication Sig Start Date End Date Taking? Authorizing Provider  albuterol (VENTOLIN HFA) 108 (90 Base) MCG/ACT inhaler Inhale 2 puffs into the lungs every 6 (six) hours as needed for wheezing or shortness of breath. 05/11/20   Lattie Haw, MD  doxycycline (VIBRAMYCIN) 100 MG capsule Take one cap PO Q12hr with food. 05/11/20   Lattie Haw, MD  guaiFENesin-codeine 100-10 MG/5ML syrup Take 15mL by mouth at bedtime as needed for cough. 05/11/20   Lattie Haw, MD  montelukast (SINGULAIR) 10 MG tablet Take 1 tablet (10 mg total) by mouth at bedtime. 06/27/16   Lurene Shadow, PA-C  predniSONE (DELTASONE) 20 MG tablet Take one tab by mouth twice daily for 4 days, then one daily for 3 days. Take with food. 05/11/20   Lattie Haw, MD    Family History Family History  Problem Relation Age of Onset  . Hypertension Mother   . Heart failure Mother   . Polycystic kidney disease Brother   . Hypertension Brother     Social History Social History   Tobacco Use  . Smoking status: Current Every Day Smoker    Packs/day: 0.50    Years: 30.00    Pack  years: 15.00    Types: Cigarettes  . Smokeless tobacco: Never Used  Vaping Use  . Vaping Use: Never used  Substance Use Topics  . Alcohol use: Yes    Alcohol/week: 2.0 standard drinks    Types: 2 Standard drinks or equivalent per week  . Drug use: Never     Allergies   Patient has no known allergies.   Review of Systems Review of Systems  See HPI  Physical Exam Triage Vital Signs ED Triage Vitals  Enc Vitals Group     BP 07/19/20 1940 (!) 150/90     Pulse Rate 07/19/20 1940 98     Resp 07/19/20 1940 16     Temp 07/19/20 1940 97.7 F (36.5 C)     Temp Source 07/19/20 1940 Oral     SpO2 07/19/20 1940 98 %     Weight --      Height --      Head Circumference --      Peak Flow --      Pain Score 07/19/20 1943 6     Pain Loc --      Pain Edu? --      Excl. in GC? --    No data found.  Updated Vital Signs  BP (!) 150/90 (BP Location: Right Arm)   Pulse 98   Temp 97.7 F (36.5 C) (Oral)   Resp 16   LMP 05/03/2015   SpO2 98%   Visual Acuity Right Eye Distance:   Left Eye Distance:   Bilateral Distance:    Right Eye Near:   Left Eye Near:    Bilateral Near:     Physical Exam Gen: NAD, alert, cooperative with exam,  ENT: normal lips, normal nasal mucosa, tympanic membranes clear and intact bilaterally, normal oropharynx, no cervical lymphadenopathy Eye: normal EOM, normal conjunctiva and lids CV:   regular rate and rhythm, S1-S2   Resp: no accessory muscle use, non-labored, clear to auscultation bilaterally, no crackles or wheezes Skin: no rashes, no areas of induration  Neuro: normal tone, normal sensation to touch Psych:  normal insight, alert and oriented MSK: Normal gait, normal strength    UC Treatments / Results  Labs (all labs ordered are listed, but only abnormal results are displayed) Labs Reviewed  NOVEL CORONAVIRUS, NAA    EKG   Radiology No results found.  Procedures Procedures (including critical care time)  Medications  Ordered in UC Medications - No data to display  Initial Impression / Assessment and Plan / UC Course  I have reviewed the triage vital signs and the nursing notes.  Pertinent labs & imaging results that were available during my care of the patient were reviewed by me and considered in my medical decision making (see chart for details).     Ms. Lamona Curl is a 50 year old female is presenting with a viral illness.  Likely Covid in nature.  Obtained swab today.  Counseled on supportive care.  Could consider infusion with history of COPD.  No wheezing on exam.  Given indications on follow-up.  Final Clinical Impressions(s) / UC Diagnoses   Final diagnoses:  Encounter for laboratory testing for COVID-19 virus  Acute upper respiratory infection     Discharge Instructions     Please try things such as zyrtec-D or allegra-D which is an antihistamine and decongestant.  Please try afrin which will help with nasal congestion but use for only three days.  Please also try using a netti pot on a regular occasion. Please try honey, vick's vapor rub, lozenges and humidifer for cough and sore throat   We will inform of results.  Please follow up if your symptoms fail to improve.     ED Prescriptions    None     PDMP not reviewed this encounter.   Myra Rude, MD 07/19/20 2131

## 2020-07-19 NOTE — Discharge Instructions (Signed)
Please try things such as zyrtec-D or allegra-D which is an antihistamine and decongestant.  Please try afrin which will help with nasal congestion but use for only three days.  Please also try using a netti pot on a regular occasion. Please try honey, vick's vapor rub, lozenges and humidifer for cough and sore throat   We will inform of results.  Please follow up if your symptoms fail to improve.

## 2020-07-22 LAB — NOVEL CORONAVIRUS, NAA: SARS-CoV-2, NAA: NOT DETECTED

## 2020-07-22 LAB — SARS-COV-2, NAA 2 DAY TAT

## 2021-03-30 IMAGING — DX DG ANKLE COMPLETE 3+V*R*
3 series · 3 of 3 positions shown · non-contrast
Comparison: None.

CLINICAL DATA: Pain and swelling for several weeks, no known
injury, initial encounter

EXAM:
RIGHT ANKLE - COMPLETE 3+ VIEW

[ankle ap]
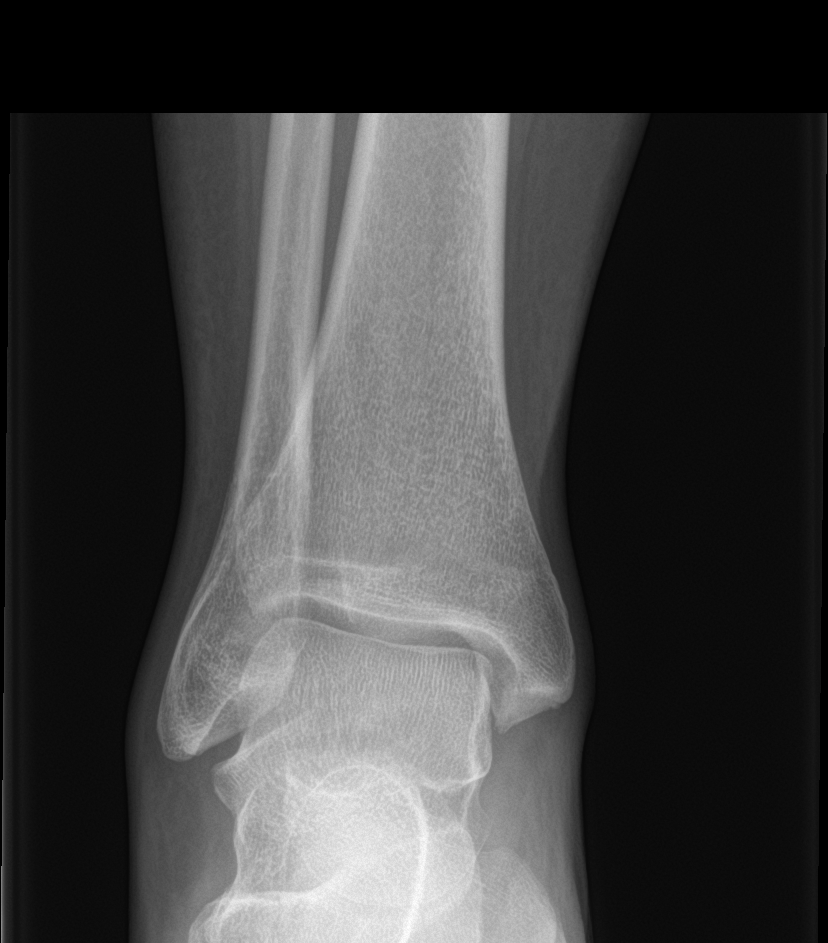

[ankle obl]
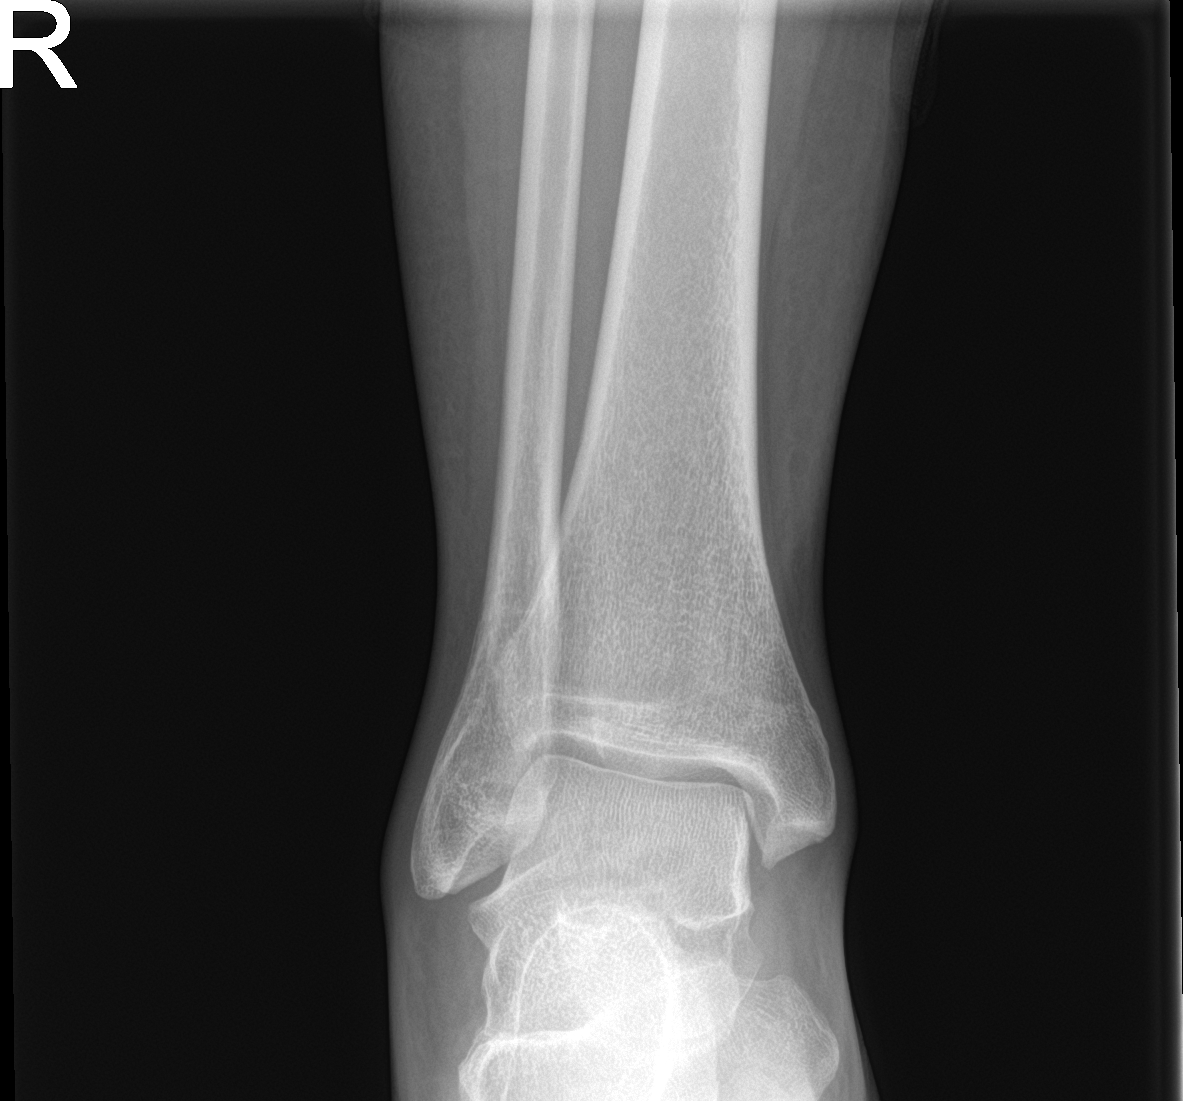

[ankle lat]
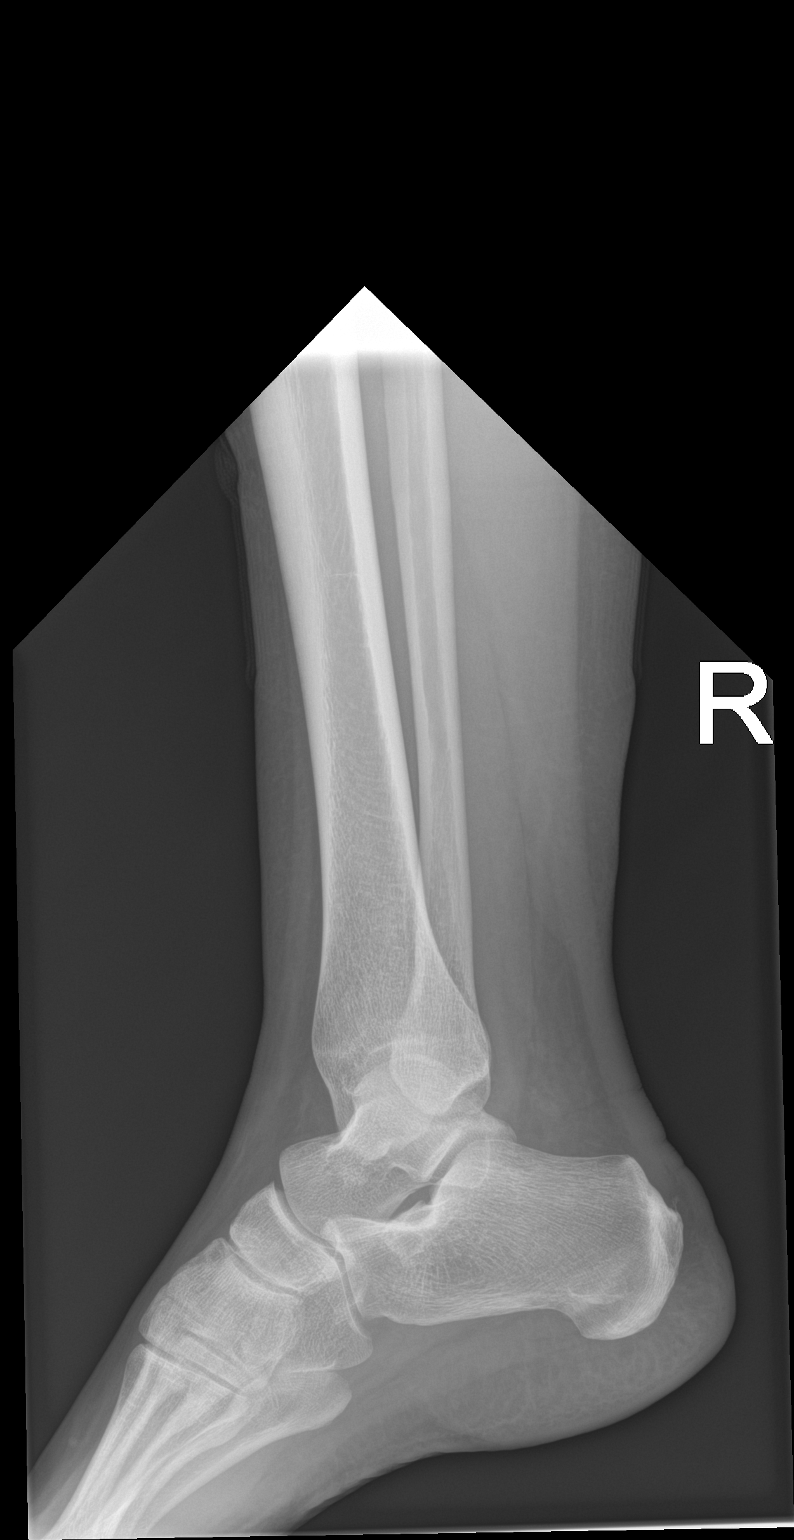

[3 of 3 positions shown; findings below may reference images not displayed]

FINDINGS: There is no evidence of fracture, dislocation, or joint effusion.
There is no evidence of arthropathy or other focal bone abnormality.
Soft tissues are unremarkable.
IMPRESSION: No acute abnormality noted.

## 2022-10-11 ENCOUNTER — Encounter: Payer: Self-pay | Admitting: *Deleted
